# Patient Record
Sex: Male | Born: 1980 | Race: Black or African American | Hispanic: No | Marital: Married | Smoking: Never smoker
Health system: Southern US, Community
[De-identification: ages and names within clinical notes are randomized; demographics above are authoritative.]

## PROBLEM LIST (undated history)

## (undated) DIAGNOSIS — E119 Type 2 diabetes mellitus without complications: Secondary | ICD-10-CM

## (undated) HISTORY — PX: TONSILLECTOMY: SUR1361

## (undated) HISTORY — PX: KNEE ARTHROSCOPY: SUR90

---

## 2005-10-12 ENCOUNTER — Emergency Department: Payer: Self-pay | Admitting: Emergency Medicine

## 2008-06-28 ENCOUNTER — Encounter (INDEPENDENT_AMBULATORY_CARE_PROVIDER_SITE_OTHER): Payer: Self-pay | Admitting: *Deleted

## 2008-06-28 ENCOUNTER — Ambulatory Visit: Payer: Self-pay | Admitting: Internal Medicine

## 2008-06-28 DIAGNOSIS — E1165 Type 2 diabetes mellitus with hyperglycemia: Secondary | ICD-10-CM

## 2008-06-28 DIAGNOSIS — I10 Essential (primary) hypertension: Secondary | ICD-10-CM | POA: Insufficient documentation

## 2008-06-28 DIAGNOSIS — E785 Hyperlipidemia, unspecified: Secondary | ICD-10-CM | POA: Insufficient documentation

## 2008-06-28 DIAGNOSIS — R809 Proteinuria, unspecified: Secondary | ICD-10-CM | POA: Insufficient documentation

## 2008-06-28 DIAGNOSIS — E119 Type 2 diabetes mellitus without complications: Secondary | ICD-10-CM | POA: Insufficient documentation

## 2008-06-28 DIAGNOSIS — IMO0002 Reserved for concepts with insufficient information to code with codable children: Secondary | ICD-10-CM | POA: Insufficient documentation

## 2008-06-28 DIAGNOSIS — E1129 Type 2 diabetes mellitus with other diabetic kidney complication: Secondary | ICD-10-CM | POA: Insufficient documentation

## 2008-06-28 DIAGNOSIS — E782 Mixed hyperlipidemia: Secondary | ICD-10-CM | POA: Insufficient documentation

## 2008-06-28 LAB — CONVERTED CEMR LAB
ALT: 17 units/L (ref 0–53)
AST: 28 units/L (ref 0–37)
Albumin: 4.6 g/dL (ref 3.5–5.2)
BUN: 11 mg/dL (ref 6–23)
Basophils Absolute: 0 10*3/uL (ref 0.0–0.1)
Basophils Relative: 0.4 % (ref 0.0–3.0)
Bilirubin Urine: NEGATIVE
CO2: 31 meq/L (ref 19–32)
Chloride: 107 meq/L (ref 96–112)
Cholesterol, target level: 200 mg/dL
Cholesterol: 179 mg/dL (ref 0–200)
Creatinine, Ser: 0.9 mg/dL (ref 0.4–1.5)
HCT: 43.7 % (ref 39.0–52.0)
HDL goal, serum: 40 mg/dL
Hemoglobin, Urine: NEGATIVE
Hemoglobin: 15.6 g/dL (ref 13.0–17.0)
Hgb A1c MFr Bld: 5.7 % (ref 4.6–6.0)
Ketones, ur: NEGATIVE mg/dL
LDL Goal: 100 mg/dL
Leukocytes, UA: NEGATIVE
Lymphocytes Relative: 37.2 % (ref 12.0–46.0)
MCHC: 35.7 g/dL (ref 30.0–36.0)
MCV: 88.2 fL (ref 78.0–100.0)
Monocytes Absolute: 0.3 10*3/uL (ref 0.1–1.0)
Neutro Abs: 4.1 10*3/uL (ref 1.4–7.7)
RBC: 4.96 M/uL (ref 4.22–5.81)
RDW: 12.2 % (ref 11.5–14.6)
Triglycerides: 81 mg/dL (ref 0–149)
pH: 5.5 (ref 5.0–8.0)

## 2008-06-29 ENCOUNTER — Encounter: Payer: Self-pay | Admitting: Internal Medicine

## 2008-07-12 ENCOUNTER — Ambulatory Visit: Payer: Self-pay | Admitting: Internal Medicine

## 2008-07-12 DIAGNOSIS — R51 Headache: Secondary | ICD-10-CM | POA: Insufficient documentation

## 2008-07-12 DIAGNOSIS — R519 Headache, unspecified: Secondary | ICD-10-CM | POA: Insufficient documentation

## 2008-12-14 ENCOUNTER — Ambulatory Visit: Payer: Self-pay | Admitting: Internal Medicine

## 2008-12-14 ENCOUNTER — Ambulatory Visit: Payer: Self-pay | Admitting: Diagnostic Radiology

## 2008-12-14 ENCOUNTER — Encounter: Payer: Self-pay | Admitting: Emergency Medicine

## 2008-12-14 ENCOUNTER — Observation Stay (HOSPITAL_COMMUNITY): Admission: AD | Admit: 2008-12-14 | Discharge: 2008-12-15 | Payer: Self-pay | Admitting: Internal Medicine

## 2011-02-11 LAB — DIFFERENTIAL
Basophils Absolute: 0.1 10*3/uL (ref 0.0–0.1)
Eosinophils Relative: 4 % (ref 0–5)
Lymphocytes Relative: 39 % (ref 12–46)
Neutro Abs: 4.1 10*3/uL (ref 1.7–7.7)

## 2011-02-11 LAB — CBC
MCHC: 35.3 g/dL (ref 30.0–36.0)
MCV: 87.8 fL (ref 78.0–100.0)
Platelets: 204 10*3/uL (ref 150–400)
RBC: 4.73 MIL/uL (ref 4.22–5.81)
RDW: 12.2 % (ref 11.5–15.5)

## 2011-02-11 LAB — GLUCOSE, CAPILLARY
Glucose-Capillary: 86 mg/dL (ref 70–99)
Glucose-Capillary: 93 mg/dL (ref 70–99)

## 2011-02-11 LAB — COMPREHENSIVE METABOLIC PANEL
AST: 27 U/L (ref 0–37)
CO2: 26 mEq/L (ref 19–32)
Calcium: 9 mg/dL (ref 8.4–10.5)
Creatinine, Ser: 0.94 mg/dL (ref 0.4–1.5)
GFR calc Af Amer: >60 mL/min (ref >60)
GFR calc non Af Amer: >60 mL/min (ref >60)

## 2011-02-11 LAB — CARDIAC PANEL(CRET KIN+CKTOT+MB+TROPI)
CK, MB: 1.1 ng/mL (ref 0.3–4.0)
CK, MB: 1.4 ng/mL (ref 0.3–4.0)
Relative Index: 0.6 (ref 0.0–2.5)
Total CK: 221 U/L (ref 7–232)
Total CK: 245 U/L — ABNORMAL HIGH (ref 7–232)
Troponin I: <0.01 ng/mL (ref 0.00–0.06)

## 2011-02-11 LAB — BASIC METABOLIC PANEL
BUN: 14 mg/dL (ref 6–23)
Calcium: 9.2 mg/dL (ref 8.4–10.5)
Creatinine, Ser: 0.8 mg/dL (ref 0.4–1.5)
GFR calc non Af Amer: >60 mL/min (ref >60)
Glucose, Bld: 118 mg/dL — ABNORMAL HIGH (ref 70–99)

## 2011-02-11 LAB — LIPID PANEL
Cholesterol: 156 mg/dL (ref 0–200)
HDL: 39 mg/dL — ABNORMAL LOW (ref >39)
Total CHOL/HDL Ratio: 4 RATIO

## 2011-02-11 LAB — POCT CARDIAC MARKERS
CKMB, poc: 1.3 ng/mL (ref 1.0–8.0)
Troponin i, poc: <0.05 ng/mL (ref 0.00–0.09)

## 2011-02-11 LAB — HEMOGLOBIN A1C: Hgb A1c MFr Bld: 6.1 % (ref 4.6–6.1)

## 2011-02-11 LAB — D-DIMER, QUANTITATIVE: D-Dimer, Quant: <0.22 ug/mL-FEU (ref 0.00–0.48)

## 2011-03-11 NOTE — Discharge Summary (Signed)
NAMEDERRIAN, POLI             ACCOUNT NO.:  [id number]   MEDICAL RECORD NO.:  [id number]          PATIENT TYPE:  OBV   LOCATION:  4729                         FACILITY:  [facility]   PHYSICIAN:  Valerie A. Felicity Coyer, MDDATE OF BIRTH:  12/06/1980   DATE OF ADMISSION:  12/14/2008  DATE OF DISCHARGE:  12/15/2008                               DISCHARGE SUMMARY   DISCHARGE DIAGNOSES:  1. Chest pain, atypical, rule out myocardial infarction negative,      suspect underlying gastroesophageal reflux disease, continue trial      proton pump inhibitor, see details below.  2. Type 2 diabetes, reportedly diet controlled for the last year,      await A1c at the time of dictation, outpatient followup with      primary care Isaiyah Feldhaus.  3. Dyslipidemia with suppressed HDL at 39.  We would recommend      initiation of statin.  The patient will follow up with primary      care.  4. Obesity, recommend continued efforts at diet, exercise, and weight      loss.   DISCHARGE MEDICATIONS:  1. Prilosec OTC 1 tablet p.o. daily x2 weeks, even if no chest pain,      burn, or indigestion symptoms.  2. Aspirin 81 mg once daily.   Hospital followup is scheduled with primary care physician, Dr. Santa Genera, at the [facility] for Monday, December 25, 2008, at 8:30  a.m. to review status of recurrent chest pain and need for outpatient  stress test also to review chronic management of diabetes, cholesterol,  and obesity.   CONDITION ON DISCHARGE:  Medically stable, asymptomatic without  recurrence of chest pain, ready for discharge home.   HOSPITAL COURSE BY PROBLEM:  1. Chest pain:  The patient is a 30 year old overweight gentleman with      history of diabetes who came to the emergency room complaining of a      burning-like sensation in the substernal region.  Initial      evaluation showed a negative chest x-ray, nonspecific EKG changes,      and negative point-of-care enzymes, but given his risk  factors for      coronary disease, he is referred for further evaluation and      admission.  He thus was transferred to [facility] from the [facility] ER      to a telemetry bed where he was monitored for any evidence of      arrhythmia and unfortunately was identified.  A D-dimer was checked      and undetectable at less than 0.22 thus excluding potential PE.      Further serial cardiac enzymes were negative for evidence of      cardiac ischemia, and EKG remained without change.  The patient's      symptoms were nonrecurrent and improved over the course of this      hospitalization, compared to initial complaints.  He did note that      the pain had a burning-like sensation, and he has subsequently been      started on Protonix  during this hospitalization.  We will continue      with a 2-week trial of proton pump inhibitor with over-the-counter      Prilosec daily over the next 2 weeks and follow up with outpatient      MD regarding the nature of symptoms if they are recurrent or not      and further evaluation by either Cardiology for stress test or GI      as needed.  Regarding his other risk factors, a fasting lipid      profile was checked, which showed a total cholesterol of 153 but a      suppressed HDL at 37, possible need of a statin.  Given his other      risk factors, it would be appropriate but defer this conversation      to follow up with primary care physician as noted.  We will also      add daily aspirin 81 mg dose to his medical regimen for      atherosclerotic disease.  Intervention, pending further followup      and eval by primary care physician.  2. Lastly, regarding his diabetes, the patient reports he has been off      medications for over a year, and CBGs this      hospitalization have been well controlled, no higher than 104 while      check on sliding scale insulin.  A1c is sent but not available at      the time of this dictation.  Further outpatient followup with       primary care physician as indicated.      Valerie A. Felicity Coyer, MD  Electronically Signed     VAL/MEDQ  D:  12/15/2008  T:  12/15/2008  Job:  [id number]

## 2011-03-11 NOTE — H&P (Signed)
Jason Bautista, Jason Bautista             ACCOUNT NO.:  [id number]   MEDICAL RECORD NO.:  [id number]          PATIENT TYPE:  INP   LOCATION:  4729                         FACILITY:  [facility]   PHYSICIAN:  Michiel Cowboy, MDDATE OF BIRTH:  1981/10/10   DATE OF ADMISSION:  12/14/2008  DATE OF DISCHARGE:                              HISTORY & PHYSICAL   PRIMARY CARE PHYSICIAN:  Dr. Yetta Barre with [facility].   CHIEF COMPLAINT:  Chest pain.   HISTORY OF PRESENT ILLNESS:  The patient is a 30 year old diabetic and  hypertensive with morbid obesity who for the past week has had  intermittent chest pain.  When went ahead and brought him in today with  a chest pain.  He woke up about 11:00 a.m.  It was sharp on the left  side of his chest, but it also kind of made his left arm kind of numb,  which scared him, and he went to see his physician.  By the time he got  to the office, the chest pain spontaneously resolved, but the physician  still recommended for him to come into emergency department to have  further evaluation.  He otherwise denies any shortness of breath.  No  diaphoresis.  No nausea, no vomiting.  No presyncope.  He had not been  taking any of his blood pressure medications because he did not like the  Benicar he was prescribed, and for his diabetes he had been checking his  blood sugars every third day, and so far, they have been running in the  90s to 100s with just diet controlled only.   PAST MEDICAL HISTORY:  1. Diet-controlled diabetes.  2. Hypertension.  Not taking medications for that.   SOCIAL HISTORY:  The patient does not smoke or drink, does not abuse  drugs.   FAMILY HISTORY:  Significant for grandmother with scleroderma, but no  early heart attacks or coronary artery disease.  No early deaths.   ALLERGIES:  No known drug allergies.   MEDICATIONS:  The patient currently not taking anything, was prescribed  Benicar in the past, but has not been taking it.   PHYSICAL  EXAMINATION:  VITALS:  Temperature 98.2, blood pressure 130/88,  pulse 71, respirations 18, saturating 99% on room air.  GENERAL:  Patient appears to be in no acute distress.  HEAD:  Nontraumatic.  Moist mucous membranes.  LUNGS:  Clear to auscultation bilaterally.  HEART:  Regular rhythm.  No murmurs, rubs or gallops.  ABDOMEN:  Soft, but obese, nontender, nondistended.  LOWER EXTREMITIES: Without clubbing, cyanosis or edema.  NEUROLOGIC:  Intact.   LABORATORY DATA:  White blood cell count 8.2, hemoglobin 15.6, sodium  142, potassium 3.9, creatinine 0.8 glucose 104.  Cardiac enzymes  negative.  I-stat, chest x-ray unremarkable.  EKG showing T-wave  inversion in leads III, no old EKG available.  There is also slight  peaked T-waves in leads V2, but otherwise, no changes.  There is a Q-  wave in lead 3, but it is isolated.   ASSESSMENT/PLAN:  This is a 30 year old with diet controlled diabetes  and hypertension who presents  with chest pain which is somewhat typical  in nature.  1. Chest pain.  Very typical, not positional, nonpleuritic.  Will      cycle cardiac enzymes given risk factors of diabetes and      hypertension.  Given abnormal EKG, will obtain serial EKGs.  Check      fasting lipid panel, hemoglobin A1c, check TSH.  Probably will need      outpatient stress test unless workup turns out positive in house.      Will check a D-dimer to be complete.  2. Hypertension.  Currently normotensive.  Will continue to follow.      Wonder if the patient could benefit from low-dose ACE inhibitor if      he turns out to be diabetic.  3. Diet controlled diabetes.  The patient has not been taking any      medications for about a year, and his blood sugars have been      normal.  Will check hemoglobin A1c, and the patient may be no      longer diabetic if it is normal as well.  While here, will put on      sliding scale insulin.  4. Prophylaxis.  Protonix and Lovenox.   Dr. Rene Paci will assume care in the a.m.      Michiel Cowboy, MD  Electronically Signed     AVD/MEDQ  D:  12/14/2008  T:  12/15/2008  Job:  [id number]   cc:   Dr. Yetta Barre

## 2013-05-17 ENCOUNTER — Other Ambulatory Visit: Payer: BC Managed Care – PPO

## 2013-05-17 DIAGNOSIS — Z130 Encounter for screening for diseases of the blood and blood-forming organs and certain disorders involving the immune mechanism: Secondary | ICD-10-CM

## 2013-05-19 LAB — HEMOGLOBINOPATHY EVALUATION
Hemoglobin Other: 0 %
Hgb F Quant: 0 % (ref 0.0–2.0)
Hgb S Quant: 0 %

## 2016-01-15 ENCOUNTER — Encounter (HOSPITAL_BASED_OUTPATIENT_CLINIC_OR_DEPARTMENT_OTHER): Payer: Self-pay

## 2016-01-15 ENCOUNTER — Emergency Department (HOSPITAL_BASED_OUTPATIENT_CLINIC_OR_DEPARTMENT_OTHER): Payer: BLUE CROSS/BLUE SHIELD

## 2016-01-15 ENCOUNTER — Emergency Department (HOSPITAL_BASED_OUTPATIENT_CLINIC_OR_DEPARTMENT_OTHER)
Admission: EM | Admit: 2016-01-15 | Discharge: 2016-01-15 | Disposition: A | Discharge status: Home / Self Care | Payer: BLUE CROSS/BLUE SHIELD | Attending: Emergency Medicine | Admitting: Emergency Medicine

## 2016-01-15 DIAGNOSIS — R319 Hematuria, unspecified: Secondary | ICD-10-CM | POA: Diagnosis present

## 2016-01-15 DIAGNOSIS — Z7984 Long term (current) use of oral hypoglycemic drugs: Secondary | ICD-10-CM | POA: Diagnosis not present

## 2016-01-15 DIAGNOSIS — Z79899 Other long term (current) drug therapy: Secondary | ICD-10-CM | POA: Insufficient documentation

## 2016-01-15 DIAGNOSIS — N3001 Acute cystitis with hematuria: Secondary | ICD-10-CM | POA: Insufficient documentation

## 2016-01-15 DIAGNOSIS — E119 Type 2 diabetes mellitus without complications: Secondary | ICD-10-CM | POA: Insufficient documentation

## 2016-01-15 HISTORY — DX: Type 2 diabetes mellitus without complications: E11.9

## 2016-01-15 LAB — URINALYSIS, ROUTINE W REFLEX MICROSCOPIC
BILIRUBIN URINE: NEGATIVE
Glucose, UA: >1000 mg/dL — AB
KETONES UR: NEGATIVE mg/dL
NITRITE: NEGATIVE
Protein, ur: 100 mg/dL — AB
Specific Gravity, Urine: 1.038 — ABNORMAL HIGH (ref 1.005–1.030)
pH: 5.5 (ref 5.0–8.0)

## 2016-01-15 LAB — URINE MICROSCOPIC-ADD ON

## 2016-01-15 MED ORDER — CIPROFLOXACIN HCL 500 MG PO TABS: 500.0000 mg | ORAL_TABLET | Freq: Once | ORAL | Status: DC

## 2016-01-15 MED ORDER — CIPROFLOXACIN HCL 500 MG PO TABS: 500.0000 mg | ORAL_TABLET | Freq: Two times a day (BID) | ORAL | Status: AC

## 2016-01-15 NOTE — ED Provider Notes (Signed)
CSN: [id number]     Arrival date & time 01/15/16  0110 History   First MD Initiated Contact with Patient 01/15/16 0241     Chief Complaint  Patient presents with  . Hematuria     (Consider location/radiation/quality/duration/timing/severity/associated sxs/prior Treatment) HPI  This is a 35 year old male who developed gross hematuria yesterday evening about 6:30 PM. His urine continues to be grossly bloody. He is having some mild low back pain but states that this is something he has from time to time and is not sure it is related. He is having some mild pain at the end of urination which he states feels like it is coming from his rectum and shooting into the end of his penis. He denies penile discharge. He denies fever, chills, nausea, vomiting or diarrhea.  Past Medical History  Diagnosis Date  . Diabetes mellitus without complication [facility](HCC)    Past Surgical History  Procedure Laterality Date  . Knee arthroscopy    . Tonsillectomy     No family history on file. Social History  Substance Use Topics  . Smoking status: Never Smoker   . Smokeless tobacco: None  . Alcohol Use: No    Review of Systems  All other systems reviewed and are negative.   Allergies  Review of patient's allergies indicates no known allergies.  Home Medications   Prior to Admission medications   Medication Sig Start Date End Date Taking? Authorizing Provider  lisinopril (PRINIVIL,ZESTRIL) 10 MG tablet Take 10 mg by mouth daily.   Yes Historical Provider, MD  metFORMIN (GLUCOPHAGE) 500 MG tablet Take by mouth 2 (two) times daily with a meal.   Yes Historical Provider, MD   BP 131/76 mmHg  Pulse 94  Temp(Src) 98.4 F (36.9 C) (Oral)  Resp 18  Ht 6' 3.5" (1.918 m)  Wt 325 lb (147.419 kg)  BMI 40.07 kg/m2  SpO2 97%   Physical Exam  General: Well-developed, well-nourished male in no acute distress; appearance consistent with age of record HENT: normocephalic; atraumatic Eyes: pupils equal, round  and reactive to light; extraocular muscles intact Neck: supple Heart: regular rate and rhythm Lungs: clear to auscultation bilaterally Abdomen: soft; nondistended; nontender; no masses or hepatosplenomegaly; bowel sounds present GU: No CVA tenderness; prostate tender Extremities: No deformity; full range of motion Neurologic: Awake, alert and oriented; motor function intact in all extremities and symmetric; no facial droop Skin: Warm and dry Psychiatric: Normal mood and affect    ED Course  Procedures (including critical care time)    MDM   Nursing notes and vitals signs, including pulse oximetry, reviewed.  Summary of this visit's results, reviewed by myself:  Labs:  Results for orders placed or performed during the hospital encounter of 01/15/16 (from the past 24 hour(s))  Urinalysis, Routine w reflex microscopic (not [facility])     Status: Abnormal   Collection Time: 01/15/16  1:35 AM  Result Value Ref Range   Color, Urine RED (A) YELLOW   APPearance TURBID (A) CLEAR   Specific Gravity, Urine 1.038 (H) 1.005 - 1.030   pH 5.5 5.0 - 8.0   Glucose, UA >1000 (A) NEGATIVE mg/dL   Hgb urine dipstick LARGE (A) NEGATIVE   Bilirubin Urine NEGATIVE NEGATIVE   Ketones, ur NEGATIVE NEGATIVE mg/dL   Protein, ur 086100 (A) NEGATIVE mg/dL   Nitrite NEGATIVE NEGATIVE   Leukocytes, UA TRACE (A) NEGATIVE  Urine microscopic-add on     Status: Abnormal   Collection Time: 01/15/16  1:35 AM  Result Value Ref Range   Squamous Epithelial / LPF 0-5 (A) NONE SEEN   WBC, UA TOO NUMEROUS TO COUNT 0 - 5 WBC/hpf   RBC / HPF TOO NUMEROUS TO COUNT 0 - 5 RBC/hpf   Bacteria, UA RARE (A) NONE SEEN   Urine-Other URINALYSIS PERFORMED ON SUPERNATANT     Imaging Studies: Ct Renal Stone Study  01/15/2016  CLINICAL DATA:  Low back pain for several months. Left greater than right. Hematuria for 8 hours. EXAM: CT ABDOMEN AND PELVIS WITHOUT CONTRAST TECHNIQUE: Multidetector CT imaging of the abdomen and pelvis  was performed following the standard protocol without IV contrast. COMPARISON:  None. FINDINGS: The lung bases are clear. The kidneys are symmetrical in size and shape. No hydronephrosis or hydroureter. No renal, ureteral, or bladder stones. No bladder wall thickening. The unenhanced appearance of the liver, spleen, gallbladder, pancreas, adrenal glands, abdominal aorta, inferior vena cava, and retroperitoneal lymph nodes is unremarkable. Stomach, small bowel, and colon are not abnormally distended. No free air or free fluid in the abdomen. Pelvis: Appendix is normal. Prostate gland is not enlarged. No free or loculated pelvic fluid collections. No pelvic mass or lymphadenopathy. No destructive bone lesions. IMPRESSION: No renal or ureteral stone or obstruction. Electronically Signed   By: Burman Nieves M.D.   On: 01/15/2016 02:10   We will treat for an infection as the patient's symptoms suggest prostatitis and his prostate was mild to moderately tender on exam. He was advised that we cannot completely rule out bladder cancer and should symptoms persist despite treatment he should follow-up with urology.     Paula Libra, MD 01/15/16 [phone]

## 2016-01-15 NOTE — ED Notes (Signed)
Pt c/o bleeding on urination since 6p with lower back pain

## 2016-01-17 LAB — URINE CULTURE

## 2016-01-18 ENCOUNTER — Telehealth: Payer: Self-pay | Admitting: *Deleted

## 2016-01-18 NOTE — ED Notes (Signed)
(+)  urine culture, reviewed by Enzo BiNathan Batchelder, treated with Ciprofloxacin, no changes needed

## 2016-03-14 DIAGNOSIS — Z0000 Encounter for general adult medical examination without abnormal findings: Secondary | ICD-10-CM | POA: Diagnosis not present

## 2016-03-14 DIAGNOSIS — R81 Glycosuria: Secondary | ICD-10-CM | POA: Diagnosis not present

## 2016-03-25 DIAGNOSIS — B353 Tinea pedis: Secondary | ICD-10-CM | POA: Diagnosis not present

## 2016-03-25 DIAGNOSIS — I10 Essential (primary) hypertension: Secondary | ICD-10-CM | POA: Diagnosis not present

## 2016-03-25 DIAGNOSIS — E119 Type 2 diabetes mellitus without complications: Secondary | ICD-10-CM | POA: Diagnosis not present

## 2016-03-25 DIAGNOSIS — Z0000 Encounter for general adult medical examination without abnormal findings: Secondary | ICD-10-CM | POA: Diagnosis not present

## 2016-03-25 DIAGNOSIS — E782 Mixed hyperlipidemia: Secondary | ICD-10-CM | POA: Diagnosis not present

## 2016-03-25 DIAGNOSIS — Z23 Encounter for immunization: Secondary | ICD-10-CM | POA: Diagnosis not present

## 2017-04-17 DIAGNOSIS — Z13220 Encounter for screening for lipoid disorders: Secondary | ICD-10-CM | POA: Diagnosis not present

## 2017-04-17 DIAGNOSIS — Z0000 Encounter for general adult medical examination without abnormal findings: Secondary | ICD-10-CM | POA: Diagnosis not present

## 2017-04-17 DIAGNOSIS — E782 Mixed hyperlipidemia: Secondary | ICD-10-CM | POA: Diagnosis not present

## 2017-04-17 DIAGNOSIS — E119 Type 2 diabetes mellitus without complications: Secondary | ICD-10-CM | POA: Diagnosis not present

## 2017-04-17 DIAGNOSIS — I10 Essential (primary) hypertension: Secondary | ICD-10-CM | POA: Diagnosis not present

## 2017-05-11 DIAGNOSIS — N390 Urinary tract infection, site not specified: Secondary | ICD-10-CM | POA: Diagnosis not present

## 2017-05-11 DIAGNOSIS — R310 Gross hematuria: Secondary | ICD-10-CM | POA: Diagnosis not present

## 2017-08-02 IMAGING — CT CT RENAL STONE PROTOCOL
2 of 4 series · 17 of 46 positions shown, 19 images · non-contrast
Comparison: None.

CLINICAL DATA: Low back pain for several months. Left greater than
right. Hematuria for 8 hours.

EXAM:
CT ABDOMEN AND PELVIS WITHOUT CONTRAST
TECHNIQUE: Multidetector CT imaging of the abdomen and pelvis was performed
following the standard protocol without IV contrast.

[Series 2: axial st · axial · 0.98mm/px · z∈[-561,-76]mm · 14 of 107 slices shown, 16 images]
[im 5/107  soft-tissue]
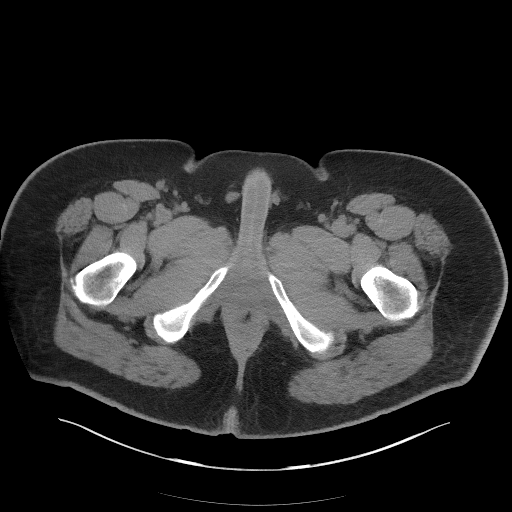
[im 5/107  bone]
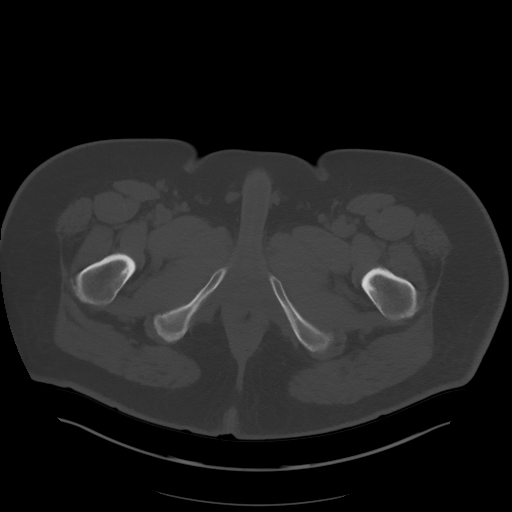
[im 14/107  soft-tissue]
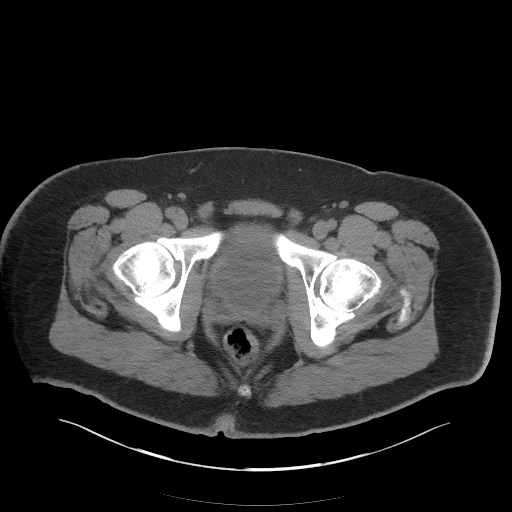
[im 19/107  soft-tissue]
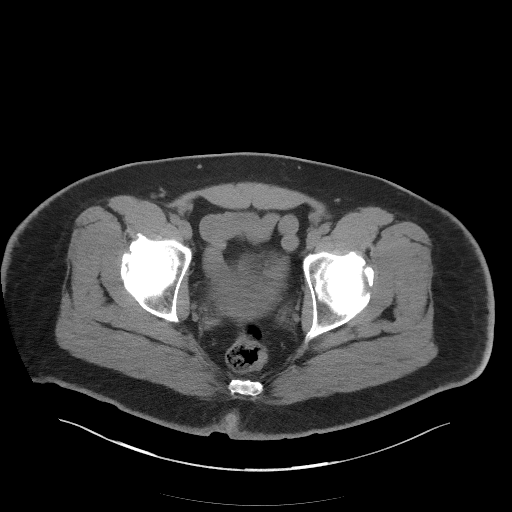
[im 28/107  soft-tissue]
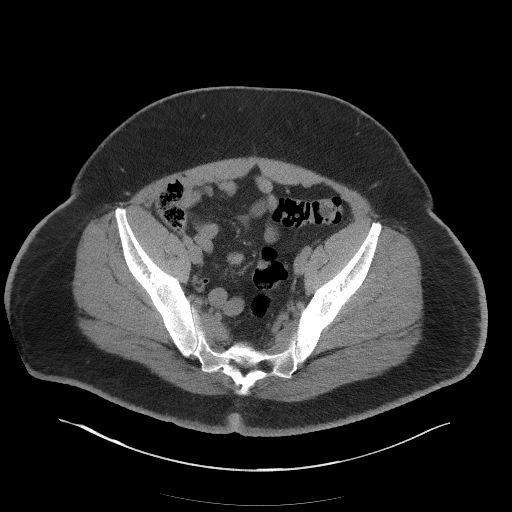
[im 37/107  soft-tissue]
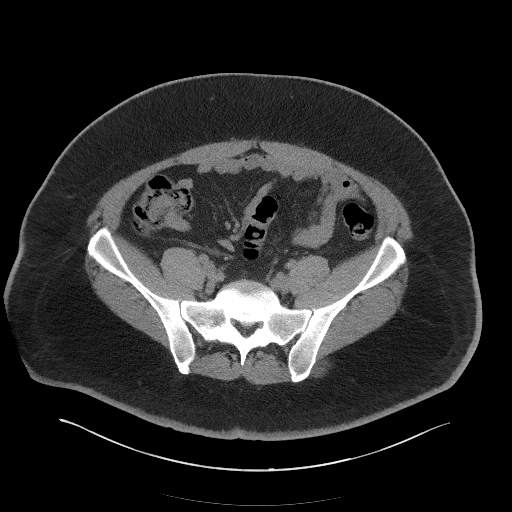
[im 42/107  soft-tissue]
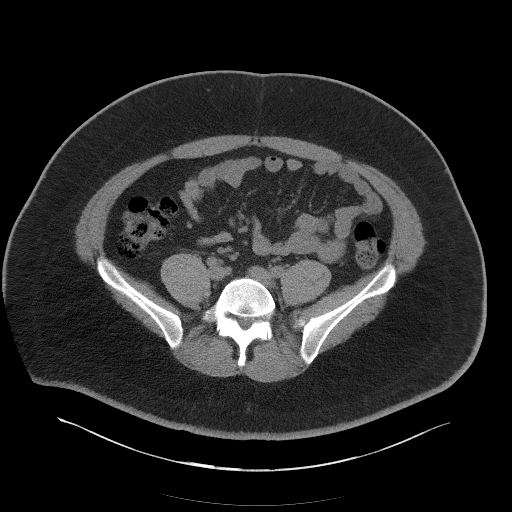
[im 51/107  soft-tissue]
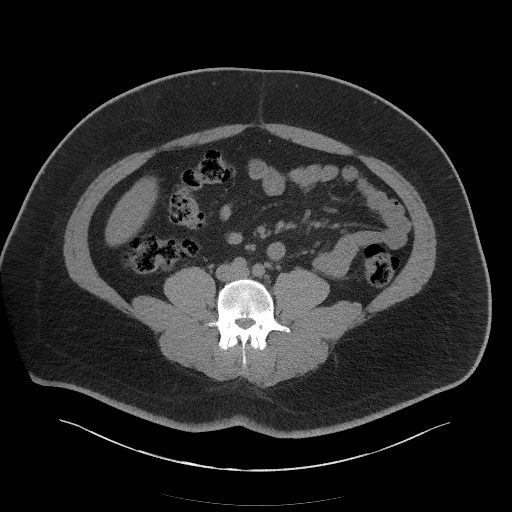
[im 56/107  soft-tissue]
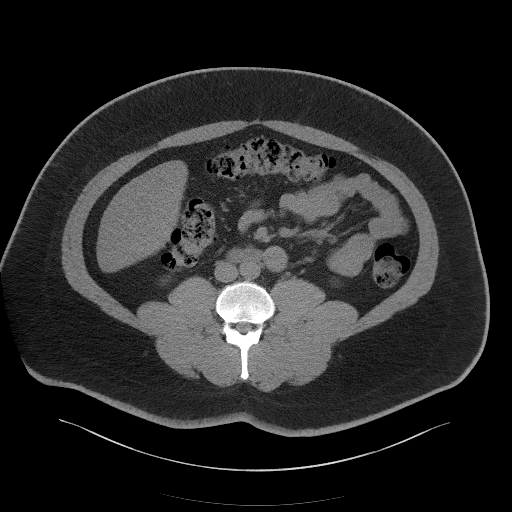
[im 65/107  soft-tissue]
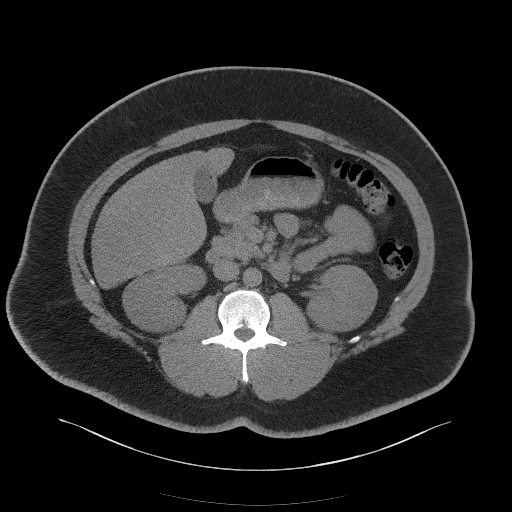
[im 65/107  bone]
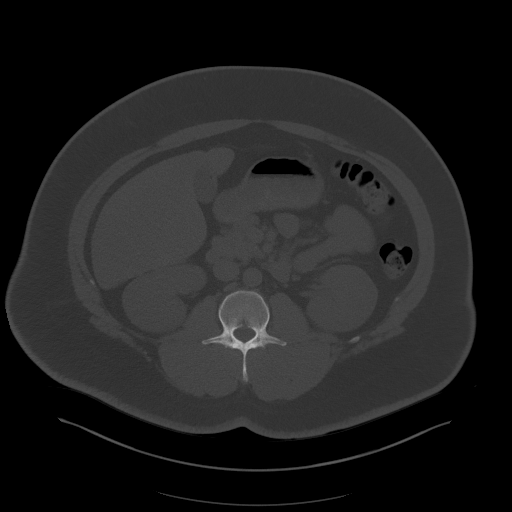
[im 70/107  soft-tissue]
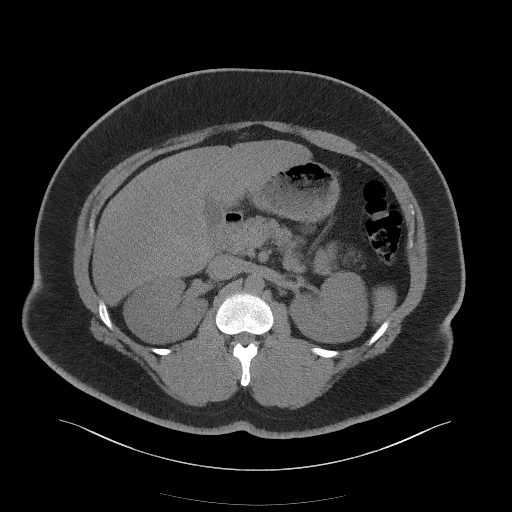
[im 79/107  soft-tissue]
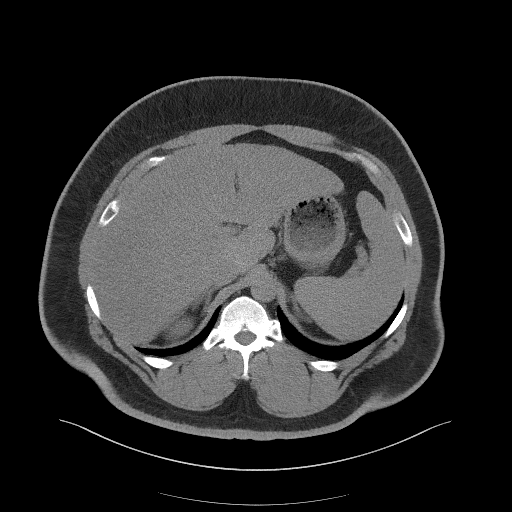
[im 88/107  soft-tissue]
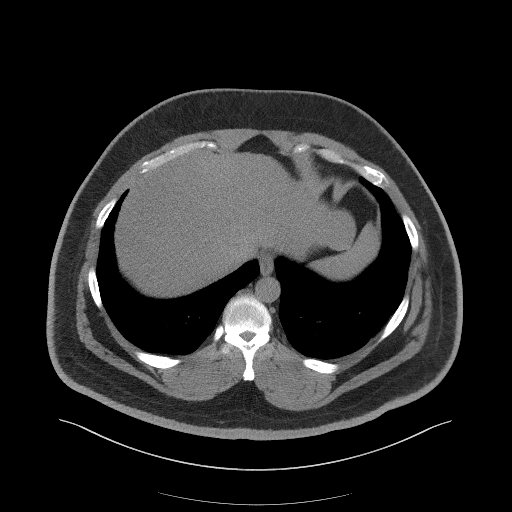
[im 93/107  soft-tissue]
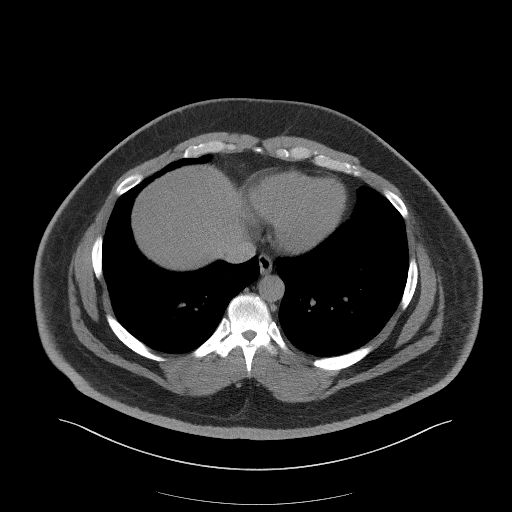
[im 102/107  soft-tissue]
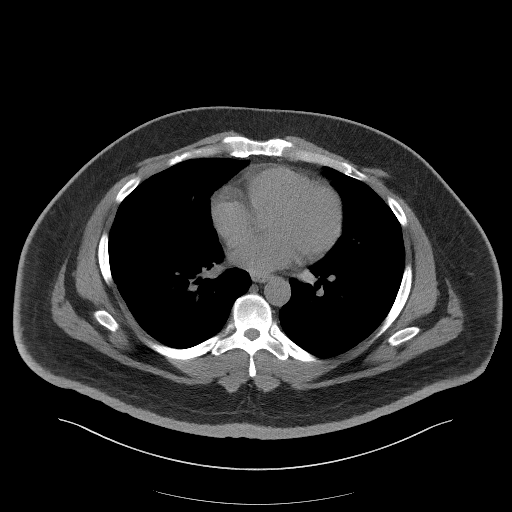

[Series 4: coronal st · coronal · 0.93mm/px · 3 of 93 slices shown]
[im 31/93  soft-tissue]
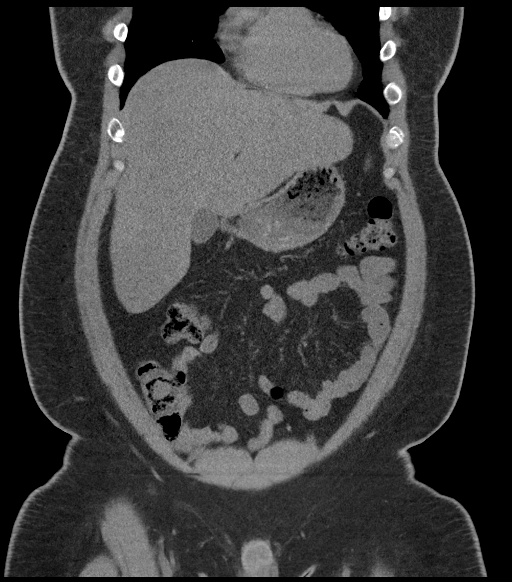
[im 41/93  soft-tissue]
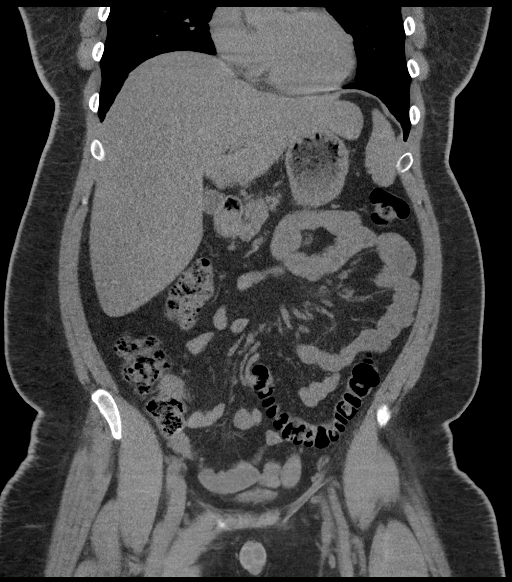
[im 52/93  soft-tissue]
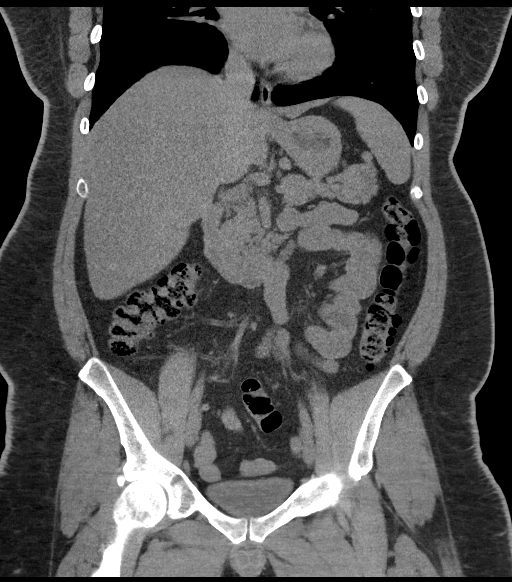

[17 of 46 positions shown; findings below may reference images not displayed]

FINDINGS: The lung bases are clear.

The kidneys are symmetrical in size and shape. No hydronephrosis or
hydroureter. No renal, ureteral, or bladder stones. No bladder wall
thickening.

The unenhanced appearance of the liver, spleen, gallbladder,
pancreas, adrenal glands, abdominal aorta, inferior vena cava, and
retroperitoneal lymph nodes is unremarkable. Stomach, small bowel,
and colon are not abnormally distended. No free air or free fluid in
the abdomen.

Pelvis: Appendix is normal. Prostate gland is not enlarged. No free
or loculated pelvic fluid collections. No pelvic mass or
lymphadenopathy. No destructive bone lesions.
IMPRESSION: No renal or ureteral stone or obstruction.

## 2017-11-20 DIAGNOSIS — J069 Acute upper respiratory infection, unspecified: Secondary | ICD-10-CM | POA: Diagnosis not present

## 2017-12-22 DIAGNOSIS — J069 Acute upper respiratory infection, unspecified: Secondary | ICD-10-CM | POA: Diagnosis not present

## 2018-07-13 DIAGNOSIS — Z0000 Encounter for general adult medical examination without abnormal findings: Secondary | ICD-10-CM | POA: Diagnosis not present

## 2018-07-13 DIAGNOSIS — E119 Type 2 diabetes mellitus without complications: Secondary | ICD-10-CM | POA: Diagnosis not present

## 2018-07-13 DIAGNOSIS — E782 Mixed hyperlipidemia: Secondary | ICD-10-CM | POA: Diagnosis not present

## 2018-07-15 DIAGNOSIS — Z0000 Encounter for general adult medical examination without abnormal findings: Secondary | ICD-10-CM | POA: Diagnosis not present

## 2018-07-15 DIAGNOSIS — E782 Mixed hyperlipidemia: Secondary | ICD-10-CM | POA: Diagnosis not present

## 2018-07-15 DIAGNOSIS — I10 Essential (primary) hypertension: Secondary | ICD-10-CM | POA: Diagnosis not present

## 2018-09-13 DIAGNOSIS — S92911A Unspecified fracture of right toe(s), initial encounter for closed fracture: Secondary | ICD-10-CM | POA: Diagnosis not present

## 2018-11-15 DIAGNOSIS — R109 Unspecified abdominal pain: Secondary | ICD-10-CM | POA: Diagnosis not present

## 2018-11-15 DIAGNOSIS — R300 Dysuria: Secondary | ICD-10-CM | POA: Diagnosis not present

## 2019-01-19 DIAGNOSIS — E162 Hypoglycemia, unspecified: Secondary | ICD-10-CM | POA: Diagnosis not present

## 2019-01-19 DIAGNOSIS — I10 Essential (primary) hypertension: Secondary | ICD-10-CM | POA: Diagnosis not present

## 2019-01-19 DIAGNOSIS — E1169 Type 2 diabetes mellitus with other specified complication: Secondary | ICD-10-CM | POA: Diagnosis not present

## 2019-01-19 DIAGNOSIS — J309 Allergic rhinitis, unspecified: Secondary | ICD-10-CM | POA: Diagnosis not present

## 2019-08-02 DIAGNOSIS — E119 Type 2 diabetes mellitus without complications: Secondary | ICD-10-CM | POA: Diagnosis not present

## 2019-08-05 DIAGNOSIS — Z0000 Encounter for general adult medical examination without abnormal findings: Secondary | ICD-10-CM | POA: Diagnosis not present

## 2019-08-05 DIAGNOSIS — B353 Tinea pedis: Secondary | ICD-10-CM | POA: Diagnosis not present

## 2019-08-05 DIAGNOSIS — F430 Acute stress reaction: Secondary | ICD-10-CM | POA: Diagnosis not present

## 2019-08-05 DIAGNOSIS — E1169 Type 2 diabetes mellitus with other specified complication: Secondary | ICD-10-CM | POA: Diagnosis not present

## 2019-08-05 DIAGNOSIS — I10 Essential (primary) hypertension: Secondary | ICD-10-CM | POA: Diagnosis not present

## 2020-02-03 DIAGNOSIS — I10 Essential (primary) hypertension: Secondary | ICD-10-CM | POA: Diagnosis not present

## 2020-02-03 DIAGNOSIS — E782 Mixed hyperlipidemia: Secondary | ICD-10-CM | POA: Diagnosis not present

## 2020-02-03 DIAGNOSIS — E1169 Type 2 diabetes mellitus with other specified complication: Secondary | ICD-10-CM | POA: Diagnosis not present

## 2020-08-13 DIAGNOSIS — Z7984 Long term (current) use of oral hypoglycemic drugs: Secondary | ICD-10-CM | POA: Diagnosis not present

## 2020-08-13 DIAGNOSIS — R319 Hematuria, unspecified: Secondary | ICD-10-CM | POA: Diagnosis not present

## 2020-08-13 DIAGNOSIS — E119 Type 2 diabetes mellitus without complications: Secondary | ICD-10-CM | POA: Diagnosis not present

## 2020-08-15 DIAGNOSIS — E782 Mixed hyperlipidemia: Secondary | ICD-10-CM | POA: Diagnosis not present

## 2020-08-15 DIAGNOSIS — Z0000 Encounter for general adult medical examination without abnormal findings: Secondary | ICD-10-CM | POA: Diagnosis not present

## 2020-08-15 DIAGNOSIS — E1169 Type 2 diabetes mellitus with other specified complication: Secondary | ICD-10-CM | POA: Diagnosis not present

## 2020-11-01 ENCOUNTER — Other Ambulatory Visit: Payer: Self-pay

## 2020-11-01 DIAGNOSIS — Z20822 Contact with and (suspected) exposure to covid-19: Secondary | ICD-10-CM

## 2020-11-06 LAB — NOVEL CORONAVIRUS, NAA: SARS-CoV-2, NAA: NOT DETECTED

## 2024-02-29 DIAGNOSIS — R5383 Other fatigue: Secondary | ICD-10-CM | POA: Diagnosis not present

## 2024-02-29 DIAGNOSIS — E1165 Type 2 diabetes mellitus with hyperglycemia: Secondary | ICD-10-CM | POA: Diagnosis not present

## 2024-02-29 DIAGNOSIS — R350 Frequency of micturition: Secondary | ICD-10-CM | POA: Diagnosis not present

## 2024-02-29 DIAGNOSIS — M545 Low back pain, unspecified: Secondary | ICD-10-CM | POA: Diagnosis not present

## 2024-02-29 DIAGNOSIS — Z6836 Body mass index (BMI) 36.0-36.9, adult: Secondary | ICD-10-CM | POA: Diagnosis not present

## 2024-09-01 DIAGNOSIS — Z0000 Encounter for general adult medical examination without abnormal findings: Secondary | ICD-10-CM | POA: Diagnosis not present

## 2024-09-01 DIAGNOSIS — E1165 Type 2 diabetes mellitus with hyperglycemia: Secondary | ICD-10-CM | POA: Diagnosis not present

## 2024-09-01 DIAGNOSIS — Z8042 Family history of malignant neoplasm of prostate: Secondary | ICD-10-CM | POA: Diagnosis not present

## 2024-09-01 DIAGNOSIS — Z13220 Encounter for screening for lipoid disorders: Secondary | ICD-10-CM | POA: Diagnosis not present

## 2024-09-07 DIAGNOSIS — E119 Type 2 diabetes mellitus without complications: Secondary | ICD-10-CM | POA: Diagnosis not present

## 2024-09-07 DIAGNOSIS — Z8639 Personal history of other endocrine, nutritional and metabolic disease: Secondary | ICD-10-CM | POA: Diagnosis not present

## 2024-09-07 DIAGNOSIS — E782 Mixed hyperlipidemia: Secondary | ICD-10-CM | POA: Diagnosis not present

## 2024-09-07 DIAGNOSIS — Z0000 Encounter for general adult medical examination without abnormal findings: Secondary | ICD-10-CM | POA: Diagnosis not present
